# Patient Record
Sex: Female | Born: 1965 | Hispanic: No | Marital: Married | Smoking: Never smoker
Health system: Southern US, Community
[De-identification: ages and names within clinical notes are randomized; demographics above are authoritative.]

## PROBLEM LIST (undated history)

## (undated) DIAGNOSIS — I10 Essential (primary) hypertension: Secondary | ICD-10-CM

## (undated) DIAGNOSIS — Z789 Other specified health status: Secondary | ICD-10-CM

## (undated) HISTORY — PX: APPENDECTOMY: SHX54

---

## 2010-10-27 HISTORY — PX: BREAST EXCISIONAL BIOPSY: SUR124

## 2011-03-17 ENCOUNTER — Other Ambulatory Visit (HOSPITAL_COMMUNITY): Payer: Self-pay | Admitting: Internal Medicine

## 2011-03-17 DIAGNOSIS — R1031 Right lower quadrant pain: Secondary | ICD-10-CM

## 2011-03-19 ENCOUNTER — Ambulatory Visit (HOSPITAL_COMMUNITY)
Admission: RE | Admit: 2011-03-19 | Discharge: 2011-03-19 | Disposition: A | Discharge status: Home / Self Care | Payer: Self-pay | Source: Ambulatory Visit | Attending: Internal Medicine | Admitting: Internal Medicine

## 2011-03-19 DIAGNOSIS — R1031 Right lower quadrant pain: Secondary | ICD-10-CM | POA: Insufficient documentation

## 2011-03-31 ENCOUNTER — Other Ambulatory Visit (HOSPITAL_COMMUNITY): Payer: Self-pay | Admitting: Internal Medicine

## 2011-03-31 DIAGNOSIS — R1031 Right lower quadrant pain: Secondary | ICD-10-CM

## 2011-04-07 ENCOUNTER — Ambulatory Visit (HOSPITAL_COMMUNITY)
Admission: RE | Admit: 2011-04-07 | Discharge: 2011-04-07 | Disposition: A | Discharge status: Home / Self Care | Payer: Self-pay | Source: Ambulatory Visit | Attending: Internal Medicine | Admitting: Internal Medicine

## 2011-04-07 DIAGNOSIS — R1031 Right lower quadrant pain: Secondary | ICD-10-CM | POA: Insufficient documentation

## 2011-04-07 DIAGNOSIS — K7689 Other specified diseases of liver: Secondary | ICD-10-CM | POA: Insufficient documentation

## 2011-04-07 LAB — CREATININE, SERUM: Creatinine, Ser: 0.57 mg/dL (ref 0.4–1.2)

## 2011-04-07 MED ORDER — GADOXETATE DISODIUM 0.25 MMOL/ML IV SOLN
8.0000 mL | Freq: Once | INTRAVENOUS | Status: AC | PRN
  Administered 2011-04-07: 8 mL via INTRAVENOUS

## 2011-05-08 ENCOUNTER — Other Ambulatory Visit: Payer: Self-pay | Admitting: Internal Medicine

## 2011-05-08 DIAGNOSIS — N6313 Unspecified lump in the right breast, lower outer quadrant: Secondary | ICD-10-CM

## 2011-05-12 ENCOUNTER — Ambulatory Visit
Admission: RE | Admit: 2011-05-12 | Discharge: 2011-05-12 | Disposition: A | Discharge status: Home / Self Care | Payer: Self-pay | Source: Ambulatory Visit | Attending: Internal Medicine | Admitting: Internal Medicine

## 2011-05-12 ENCOUNTER — Other Ambulatory Visit: Payer: Self-pay | Admitting: Internal Medicine

## 2011-05-12 DIAGNOSIS — N6313 Unspecified lump in the right breast, lower outer quadrant: Secondary | ICD-10-CM

## 2011-05-13 ENCOUNTER — Other Ambulatory Visit: Payer: Self-pay | Admitting: Internal Medicine

## 2011-05-13 ENCOUNTER — Other Ambulatory Visit: Payer: Self-pay | Admitting: Radiology

## 2011-05-13 ENCOUNTER — Ambulatory Visit
Admission: RE | Admit: 2011-05-13 | Discharge: 2011-05-13 | Disposition: A | Discharge status: Home / Self Care | Payer: Self-pay | Source: Ambulatory Visit | Attending: Internal Medicine | Admitting: Internal Medicine

## 2011-05-13 DIAGNOSIS — N6313 Unspecified lump in the right breast, lower outer quadrant: Secondary | ICD-10-CM

## 2011-05-14 ENCOUNTER — Ambulatory Visit
Admission: RE | Admit: 2011-05-14 | Discharge: 2011-05-14 | Disposition: A | Discharge status: Home / Self Care | Payer: Self-pay | Source: Ambulatory Visit | Attending: Internal Medicine | Admitting: Internal Medicine

## 2011-05-14 DIAGNOSIS — N6313 Unspecified lump in the right breast, lower outer quadrant: Secondary | ICD-10-CM

## 2011-05-21 ENCOUNTER — Emergency Department (HOSPITAL_COMMUNITY)
Admission: EM | Admit: 2011-05-21 | Discharge: 2011-05-22 | Disposition: A | Discharge status: Home / Self Care | Payer: Self-pay | Attending: Emergency Medicine | Admitting: Emergency Medicine

## 2011-05-21 ENCOUNTER — Emergency Department: Payer: Self-pay

## 2011-05-21 DIAGNOSIS — R10819 Abdominal tenderness, unspecified site: Secondary | ICD-10-CM | POA: Insufficient documentation

## 2011-05-21 DIAGNOSIS — R1031 Right lower quadrant pain: Secondary | ICD-10-CM | POA: Insufficient documentation

## 2011-05-21 DIAGNOSIS — N949 Unspecified condition associated with female genital organs and menstrual cycle: Secondary | ICD-10-CM | POA: Insufficient documentation

## 2011-05-21 LAB — DIFFERENTIAL
Basophils Absolute: 0 10*3/uL (ref 0.0–0.1)
Eosinophils Relative: 4 % (ref 0–5)
Lymphocytes Relative: 26 % (ref 12–46)
Monocytes Absolute: 0.6 10*3/uL (ref 0.1–1.0)

## 2011-05-21 LAB — URINALYSIS, ROUTINE W REFLEX MICROSCOPIC
Bilirubin Urine: NEGATIVE
Ketones, ur: NEGATIVE mg/dL
Nitrite: NEGATIVE
Urobilinogen, UA: 0.2 mg/dL (ref 0.0–1.0)

## 2011-05-21 LAB — POCT PREGNANCY, URINE: Preg Test, Ur: NEGATIVE

## 2011-05-21 LAB — COMPREHENSIVE METABOLIC PANEL
Albumin: 4 g/dL (ref 3.5–5.2)
Alkaline Phosphatase: 90 U/L (ref 39–117)
BUN: 9 mg/dL (ref 6–23)
Creatinine, Ser: 0.52 mg/dL (ref 0.50–1.10)
Potassium: 3.5 mEq/L (ref 3.5–5.1)
Total Protein: 8.2 g/dL (ref 6.0–8.3)

## 2011-05-21 LAB — CBC
HCT: 37.4 % (ref 36.0–46.0)
MCHC: 33.2 g/dL (ref 30.0–36.0)
RDW: 13 % (ref 11.5–15.5)

## 2011-05-22 ENCOUNTER — Inpatient Hospital Stay (HOSPITAL_COMMUNITY)
Admission: AD | Admit: 2011-05-22 | Discharge: 2011-05-22 | Disposition: A | Discharge status: Home / Self Care | Payer: Self-pay | Source: Ambulatory Visit | Attending: Obstetrics & Gynecology | Admitting: Obstetrics & Gynecology

## 2011-05-22 ENCOUNTER — Encounter (HOSPITAL_COMMUNITY): Payer: Self-pay | Admitting: *Deleted

## 2011-05-22 ENCOUNTER — Emergency Department (HOSPITAL_COMMUNITY): Payer: Self-pay

## 2011-05-22 DIAGNOSIS — N83209 Unspecified ovarian cyst, unspecified side: Secondary | ICD-10-CM | POA: Insufficient documentation

## 2011-05-22 DIAGNOSIS — R109 Unspecified abdominal pain: Secondary | ICD-10-CM | POA: Insufficient documentation

## 2011-05-22 HISTORY — DX: Other specified health status: Z78.9

## 2011-05-22 MED ORDER — HYDROCODONE-ACETAMINOPHEN 7.5-500 MG PO TABS: 1.0000 | ORAL_TABLET | Freq: Four times a day (QID) | ORAL | Status: AC | PRN

## 2011-05-22 MED ORDER — KETOROLAC TROMETHAMINE 60 MG/2ML IM SOLN
60.0000 mg | Freq: Once | INTRAMUSCULAR | Status: AC
  Administered 2011-05-22: 60 mg via INTRAMUSCULAR
  Filled 2011-05-22: qty 2

## 2011-05-22 NOTE — ED Provider Notes (Signed)
History   Pt presents for what she understands to be a follow-up from an ER visit to [facility] last pm for right abd pain. Had labs and [place] were reviewed and showed a possible R ovarian cyst. Did not fill pain rx (ibuprofen/Percocet) that were given, and still with right abd pain currently.  States pain comes approx q 3 wks and then spont resolves.  Chief Complaint  Patient presents with  . Abdominal Pain   HPI  OB History    Grav Para Term Preterm Abortions TAB SAB Ect Mult Living   2 2 2       2       Past Medical History  Diagnosis Date  . No pertinent past medical history     Past Surgical History  Procedure Date  . Cesarean section     No family history on file.  History  Substance Use Topics  . Smoking status: Never Smoker   . Smokeless tobacco: Never Used  . Alcohol Use: No    Allergies: No Known Allergies  No prescriptions prior to admission    ROS Physical Exam   Blood pressure 153/77, pulse 85, temperature 98.3 F (36.8 C), temperature source Oral, resp. rate 18, height 5\' 4"  (1.626 m), weight 80.287 kg (177 lb).  Physical Exam Abd: soft, tender to palp on right, no rebound, no guard MAU Course  Procedures  MDM   Assessment and Plan  Abd pain, possibly from ovarian cyst  Rec to fill pain med rxes. Will give Toradol 60mg  here and will place pt for a 1-2 wk appt at gyn clinic for further eval.  SHAW,KIMBERLY B 05/22/2011, 9:03 PM

## 2011-05-22 NOTE — Progress Notes (Signed)
Pt per interpreter states she has lower abd pain x 6 months off/on. Seen at [facility] long er last pm and had an u/s and was told she had a cyst on the right and [facility] . Denies dysuria, abn bleeding, nausea, vomiting diarrhea, and fever. Was given ibuprofen and it helps some

## 2011-05-24 ENCOUNTER — Inpatient Hospital Stay (HOSPITAL_COMMUNITY): Admission: RE | Admit: 2011-05-24 | Payer: Self-pay | Source: Ambulatory Visit

## 2011-06-03 ENCOUNTER — Other Ambulatory Visit: Payer: Self-pay | Admitting: Family Medicine

## 2011-06-03 DIAGNOSIS — N83209 Unspecified ovarian cyst, unspecified side: Secondary | ICD-10-CM

## 2011-06-17 ENCOUNTER — Other Ambulatory Visit (HOSPITAL_COMMUNITY): Payer: Self-pay

## 2011-07-02 ENCOUNTER — Encounter: Payer: Self-pay | Admitting: Obstetrics & Gynecology

## 2012-10-06 IMAGING — US US ABDOMEN COMPLETE
1 series · 14 of 25 positions shown · non-contrast
Comparison: None.

CLINICAL DATA: Right lower quadrant abdominal pain.

COMPLETE ABDOMINAL ULTRASOUND

[Series 1: us abdomen complete · 0.26mm/px · 14 of 70 slices shown]
[im 1/70]
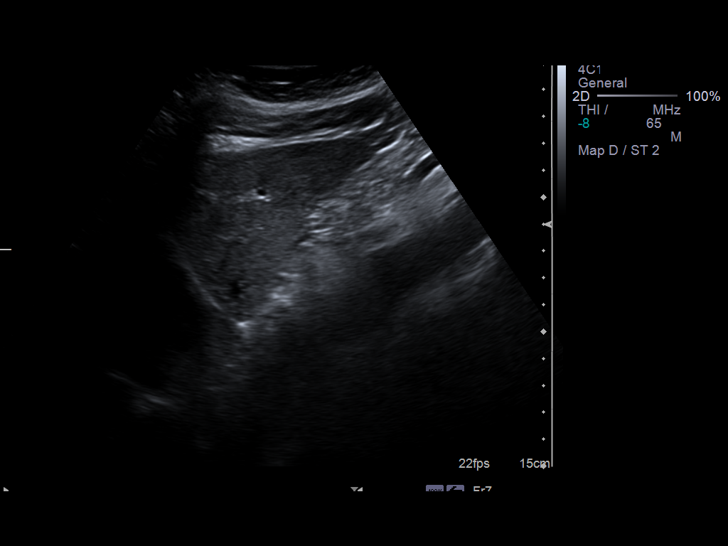
[im 6/70]
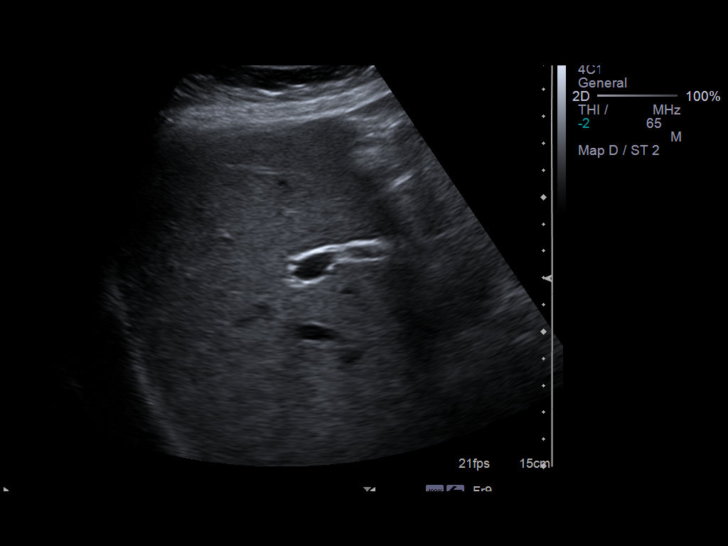
[im 12/70]
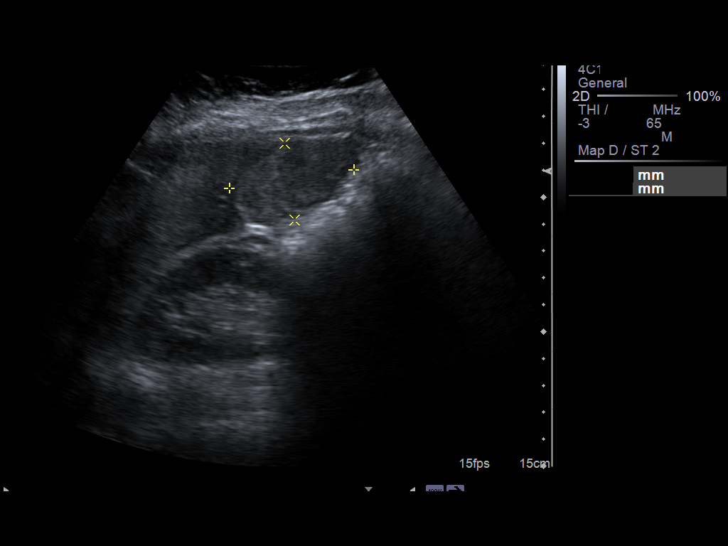
[im 18/70]
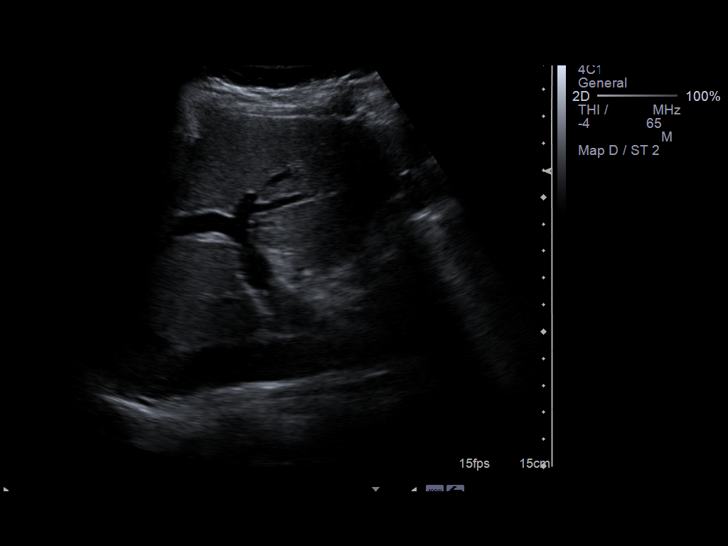
[im 24/70]
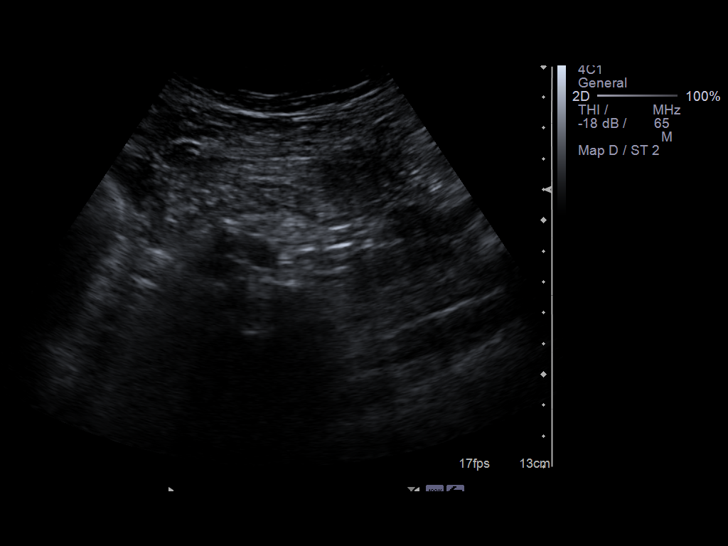
[im 26/70]
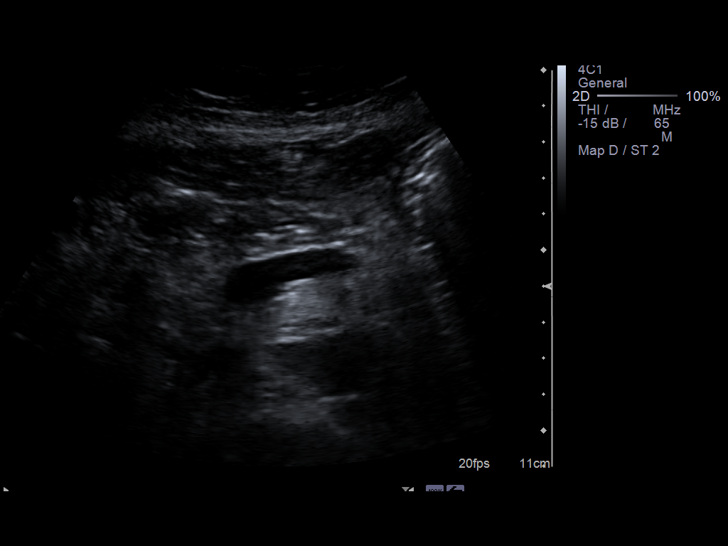
[im 32/70]
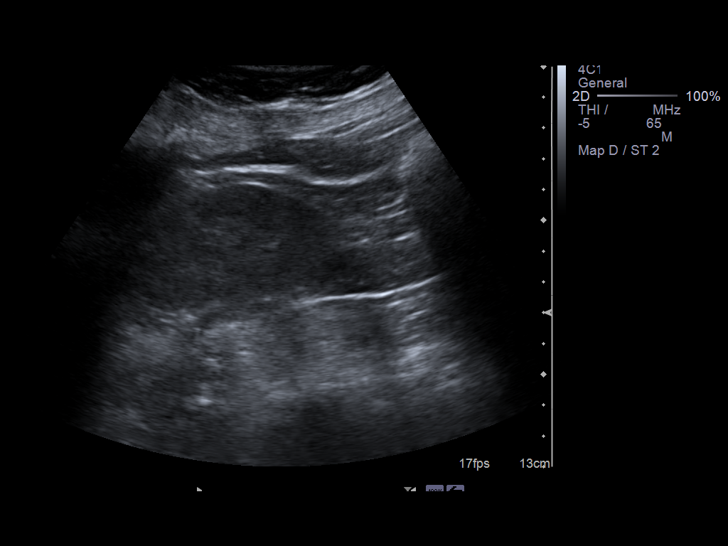
[im 38/70]
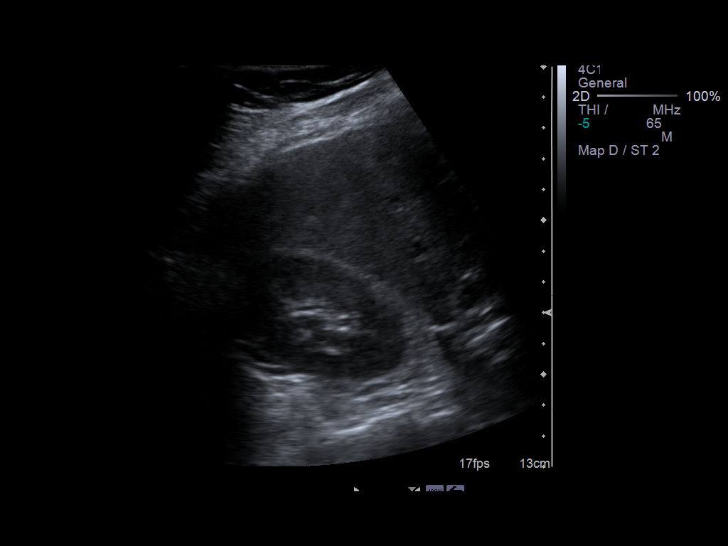
[im 44/70]
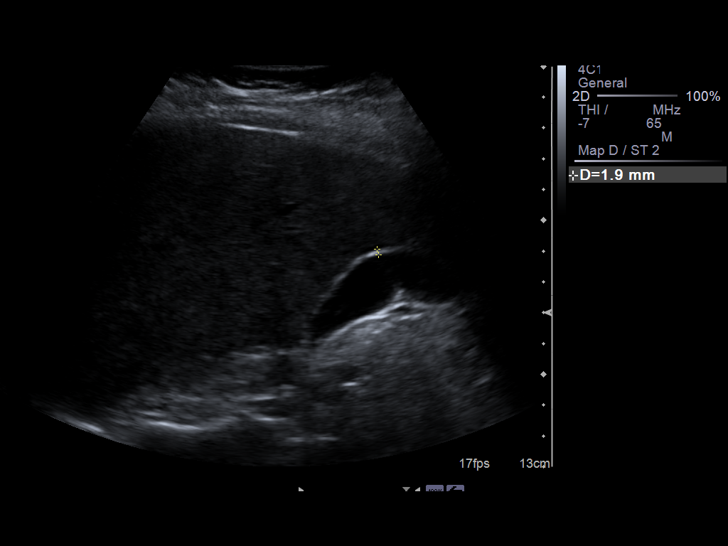
[im 47/70]
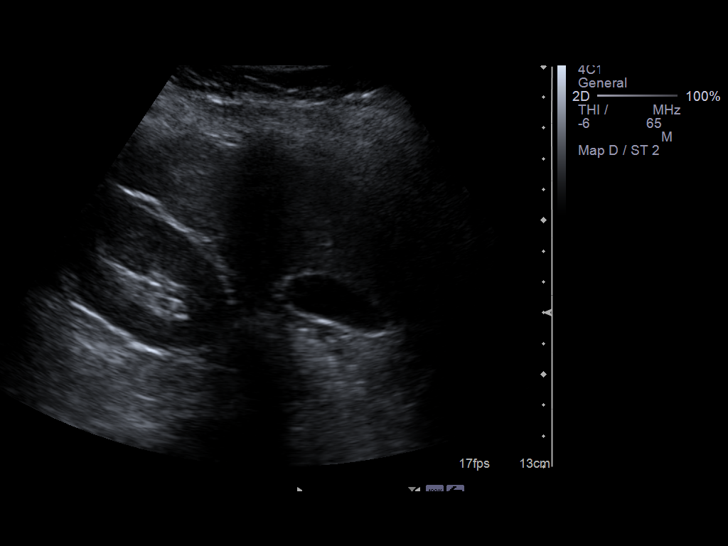
[im 52/70]
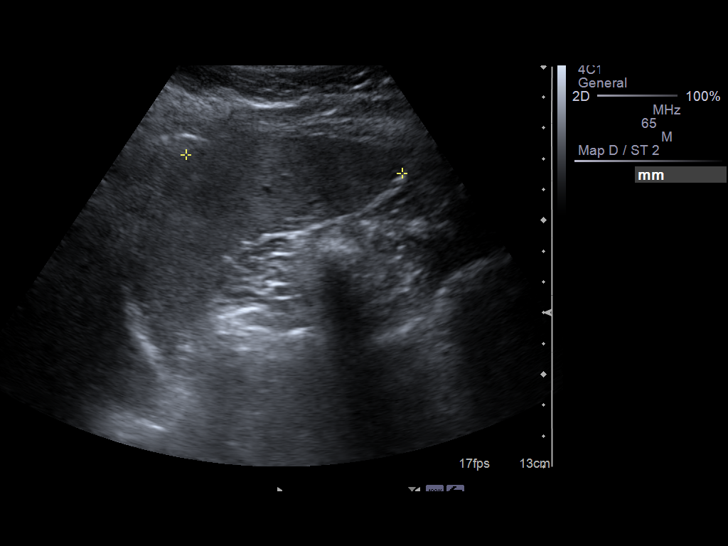
[im 58/70]
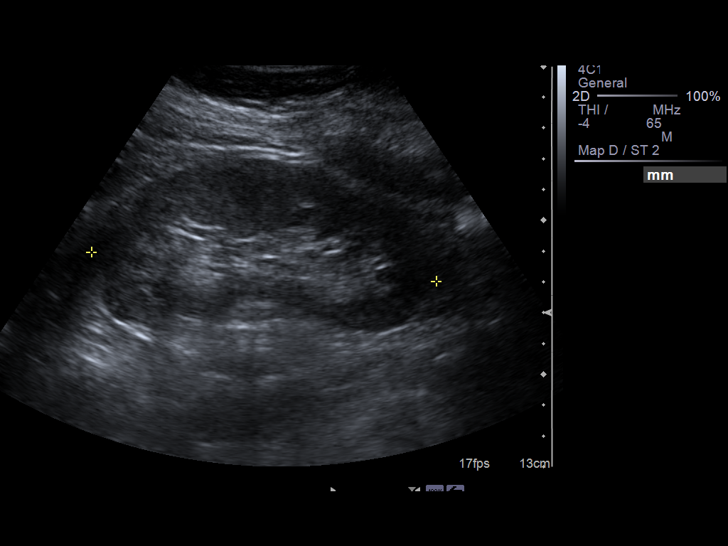
[im 64/70]
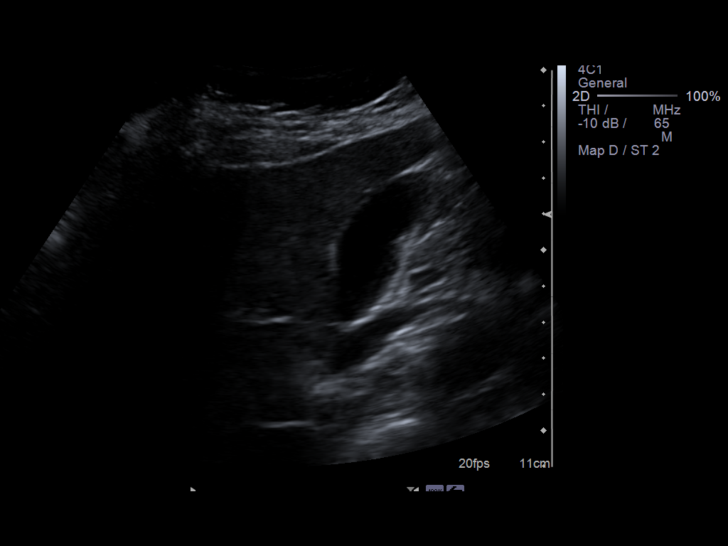
[im 70/70]
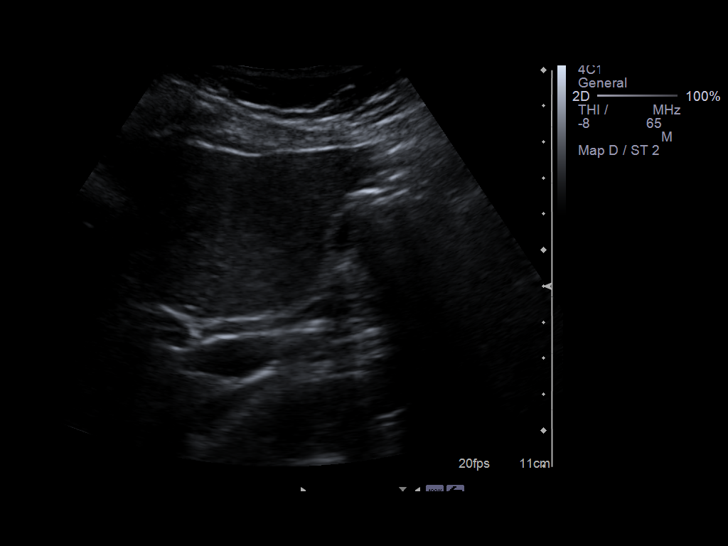

[14 of 25 positions shown; findings below may reference images not displayed]

FINDINGS: Gallbladder:  Normal, without wall thickening, stone, or
pericholecystic fluid.  Sonographic Murphy's sign was not elicited.

Common bile duct: Normal, at 4 mm.

Liver: Within the right lobe of the liver, subcapsular area of
vague relative hyperechogenicity measures 4.7 x 2.9 x 4.0 cm.
Images 13 and 17.  Underlying heterogeneous hepatic echotexture
identified.

IVC: Negative

Pancreas:  Negative

Spleen:  Normal in size and echogenicity.

Right Kidney:  11.1 cm. No hydronephrosis.

Left Kidney:  11.2 cm. No hydronephrosis.

Abdominal aorta:  Nonaneurysmal without ascites.
IMPRESSION: 1.  Heterogeneous hepatic echotexture likely represents hepatic
steatosis.  An area of more focal hyperechogenicity in the
subcapsular right lobe is indeterminate.  Although this could be an
area of focal steatosis, a true mass cannot be excluded.  Further
characterization with pre and post contrast abdominal MRI, using
Eovist intravenous contrast is recommended.  The study was made a
"call report."
2.  No other acute process or explanation for right-sided pain.

## 2014-08-28 ENCOUNTER — Encounter (HOSPITAL_COMMUNITY): Payer: Self-pay | Admitting: *Deleted

## 2018-08-12 ENCOUNTER — Other Ambulatory Visit: Payer: Self-pay | Admitting: Nurse Practitioner

## 2018-08-12 ENCOUNTER — Other Ambulatory Visit: Payer: Self-pay | Admitting: Family Medicine

## 2018-08-17 ENCOUNTER — Other Ambulatory Visit (HOSPITAL_COMMUNITY): Payer: Self-pay | Admitting: *Deleted

## 2018-08-17 DIAGNOSIS — N6310 Unspecified lump in the right breast, unspecified quadrant: Secondary | ICD-10-CM

## 2018-09-06 ENCOUNTER — Other Ambulatory Visit (HOSPITAL_COMMUNITY): Payer: Self-pay | Admitting: *Deleted

## 2018-09-06 DIAGNOSIS — Z0000 Encounter for general adult medical examination without abnormal findings: Secondary | ICD-10-CM

## 2018-09-07 ENCOUNTER — Encounter (HOSPITAL_COMMUNITY): Payer: Self-pay

## 2018-09-07 ENCOUNTER — Ambulatory Visit (HOSPITAL_COMMUNITY)
Admission: RE | Admit: 2018-09-07 | Discharge: 2018-09-07 | Disposition: A | Discharge status: Home / Self Care | Payer: Self-pay | Source: Ambulatory Visit | Attending: Obstetrics and Gynecology | Admitting: Obstetrics and Gynecology

## 2018-09-07 VITALS — BP 162/94 | Wt 188.0 lb

## 2018-09-07 DIAGNOSIS — N644 Mastodynia: Secondary | ICD-10-CM

## 2018-09-07 DIAGNOSIS — Z1239 Encounter for other screening for malignant neoplasm of breast: Secondary | ICD-10-CM

## 2018-09-07 DIAGNOSIS — N6312 Unspecified lump in the right breast, upper inner quadrant: Secondary | ICD-10-CM

## 2018-09-07 HISTORY — DX: Essential (primary) hypertension: I10

## 2018-09-07 NOTE — Patient Instructions (Signed)
Explained breast self awareness with Gianella Welke. Patient did not need a Pap smear today due to last Pap smear was in October 2019 per patient. Let her know [facility] will cover Pap smears every 3 years unless has a history of abnormal Pap smears. Referred patient to the [facility] for a diagnostic mammogram and possible right breast ultrasound. Appointment scheduled for Wednesday, September 08, 2018 at 1030. Patient aware of appointment and will be there. Angel Riley verbalized understanding.  Brannock, Kathaleen Maser, RN 3:55 PM

## 2018-09-07 NOTE — Progress Notes (Signed)
Complaints of a right breast lump and pain x 7 years. Patient states the pain was constant 5 years ago but now is only painful when touches. Patient rates the pain at a 3 out of 10.  Pap Smear: Pap smear not completed today. [facility] and awaiting results. Per patient has no history of an abnormal Pap smear. No Pap smear results are in Epic.  Physical exam: Breasts Breasts symmetrical. No skin abnormalities bilateral breasts. No nipple retraction bilateral breasts. No nipple discharge bilateral breasts. No lymphadenopathy. No lumps palpated left breast. Palpated a lump within the right breast at 2 o'clock 2 cm from the nipple. Complaints of tenderness when palpated lump. [facility] for a diagnostic mamm[facility] and possible right breast ultrasound. Appointment scheduled for Wednesday, September 08, 2018 at 1030.        Pelvic/Bimanual No Pap smear completed today since last Pap smear was in October 2019. Pap smear not indicated per [facility] guidelines.   Smoking History: Patient has never smoked.  Patient Navigation: Patient education provided. [facility] to [facility]. Amharic interpreter provided.   Colorectal Cancer Screening: Per patient has never had a colonoscopy completed. No complaints today. FIT Test given to patient to complete and return to [facility].  Breast and Cervical Cancer Risk Assessment: Patient has a family history of a sister having breast cancer. Patient has no known genetic mutations or history of radiation treatment to the chest before age 52. Patient has no history of cervical dysplasia, immunocompromised, or DES exposure in-utero.  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

## 2018-09-08 ENCOUNTER — Ambulatory Visit
Admission: RE | Admit: 2018-09-08 | Discharge: 2018-09-08 | Disposition: A | Discharge status: Home / Self Care | Payer: Self-pay | Source: Ambulatory Visit | Attending: Obstetrics and Gynecology | Admitting: Obstetrics and Gynecology

## 2018-09-08 ENCOUNTER — Ambulatory Visit: Payer: Self-pay

## 2018-09-08 DIAGNOSIS — N6310 Unspecified lump in the right breast, unspecified quadrant: Secondary | ICD-10-CM

## 2018-09-09 NOTE — Addendum Note (Signed)
Addended by: Diangelo Radel on: 09/09/2018 01:06 PM   Modules accepted: Orders  

## 2018-09-09 NOTE — Addendum Note (Signed)
Addended by: Kathreen CornfieldSMITH, Zaelyn Barbary on: 09/09/2018 01:06 PM   Modules accepted: Orders

## 2018-09-10 ENCOUNTER — Inpatient Hospital Stay: Payer: PRIVATE HEALTH INSURANCE

## 2018-09-10 ENCOUNTER — Encounter (HOSPITAL_COMMUNITY): Payer: Self-pay

## 2018-09-10 ENCOUNTER — Ambulatory Visit: Payer: Self-pay

## 2018-09-10 ENCOUNTER — Other Ambulatory Visit: Payer: Self-pay

## 2018-09-17 ENCOUNTER — Encounter (HOSPITAL_COMMUNITY): Payer: Self-pay | Admitting: *Deleted

## 2018-09-20 LAB — FECAL OCCULT BLOOD, IMMUNOCHEMICAL: FECAL OCCULT BLD: NEGATIVE

## 2018-10-04 ENCOUNTER — Encounter (HOSPITAL_COMMUNITY): Payer: Self-pay

## 2020-03-28 IMAGING — MG DIGITAL DIAGNOSTIC BILATERAL MAMMOGRAM WITH TOMO AND CAD
6 of 10 series · 6 of 30 positions shown · non-contrast
Comparison: 05/12/2011 mammogram and ultrasound

CLINICAL DATA: 52-year-old female with palpable mass and discomfort
within the UPPER INNER RIGHT breast for many years and for annual
bilateral mammogram. History of biopsy-proven fibroadenoma within
the UPPER INNER RIGHT breast.

EXAM:
DIGITAL DIAGNOSTIC BILATERAL MAMMOGRAM WITH CAD AND TOMO

[R MLO synth-2D]
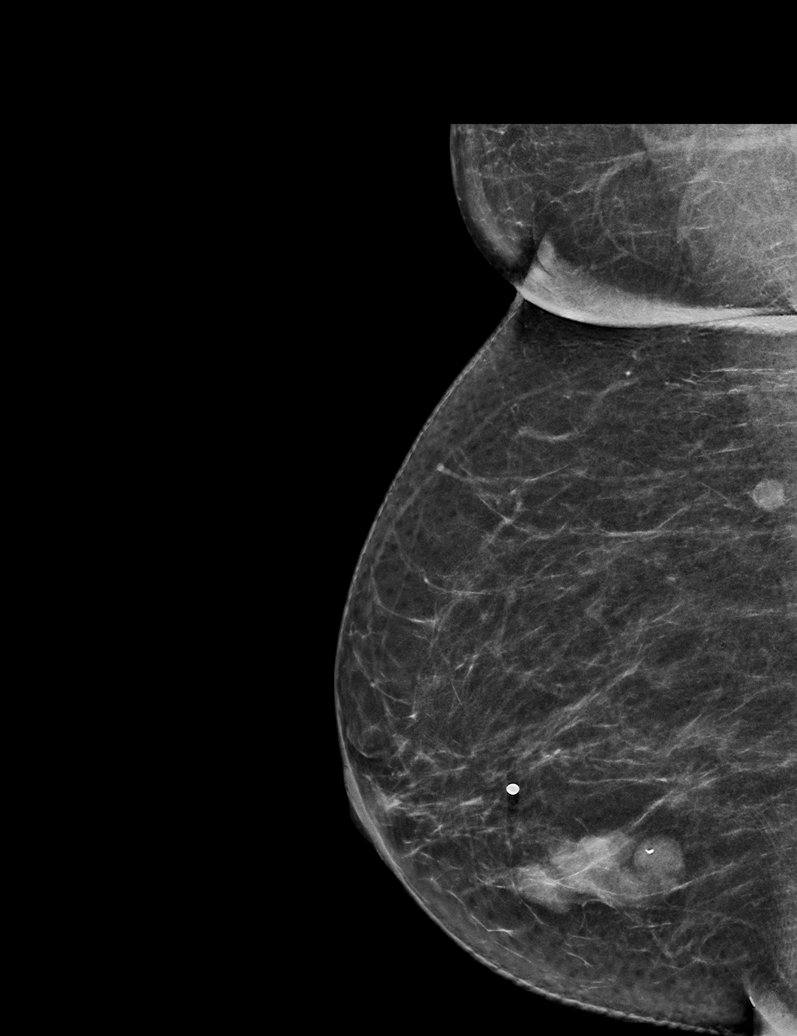

[R CC synth-2D (1 of 2)]
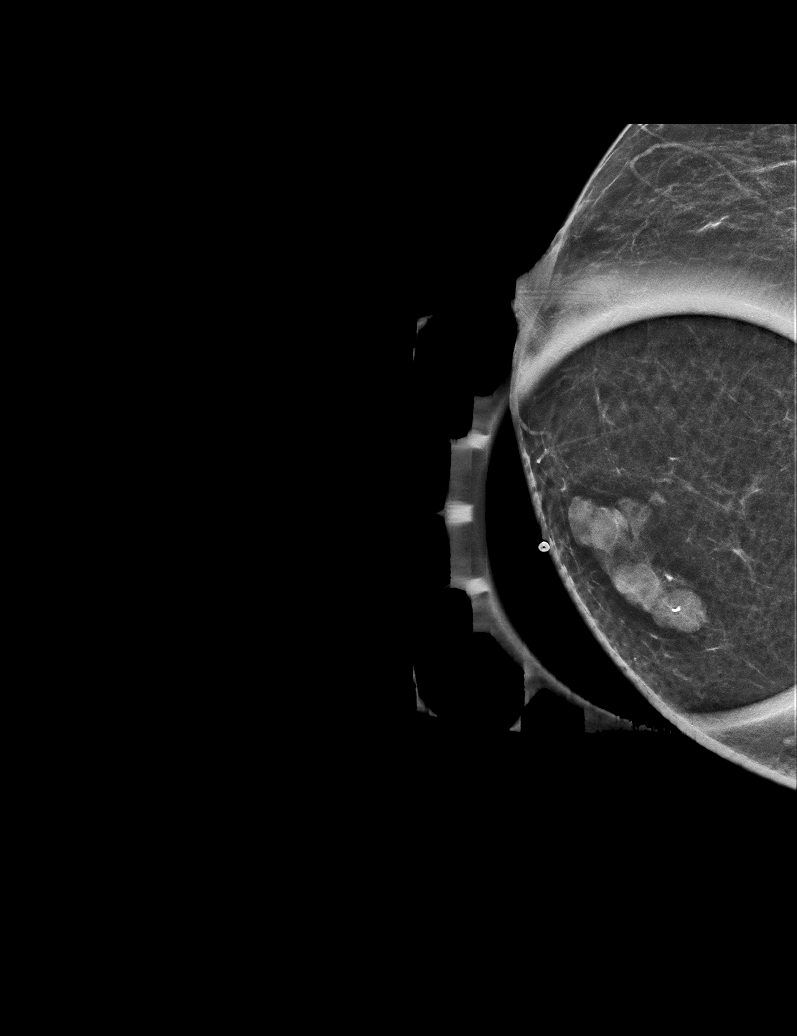

[L MLO synth-2D]
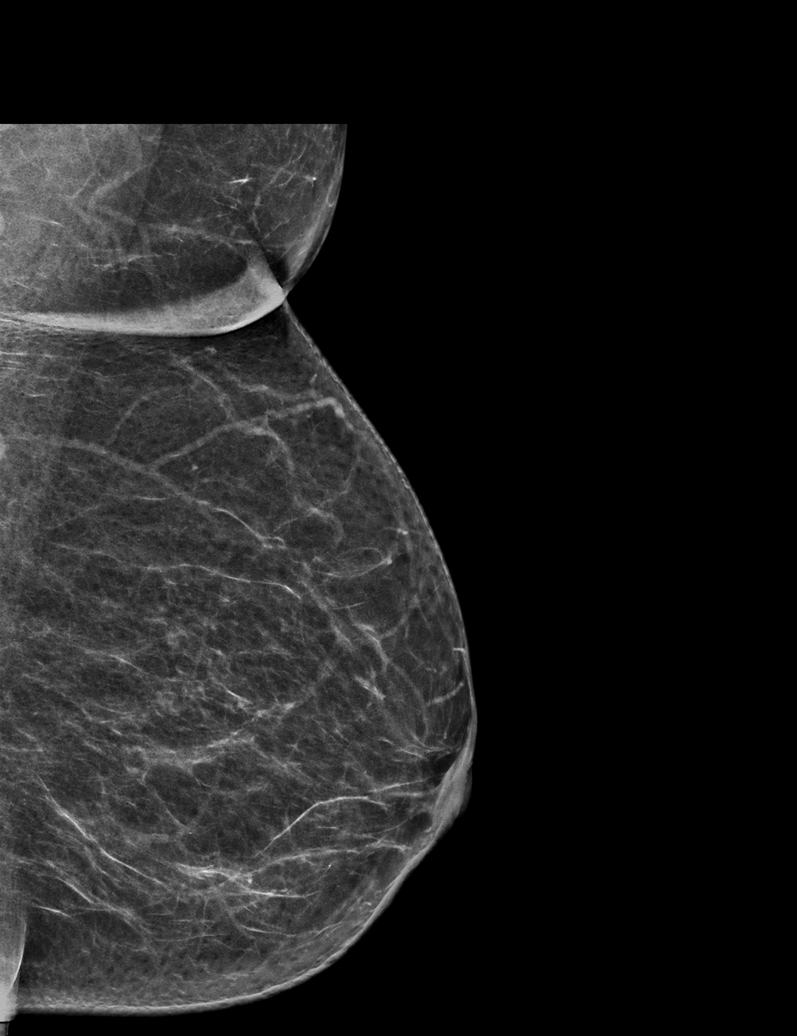

[R CC synth-2D (2 of 2)]
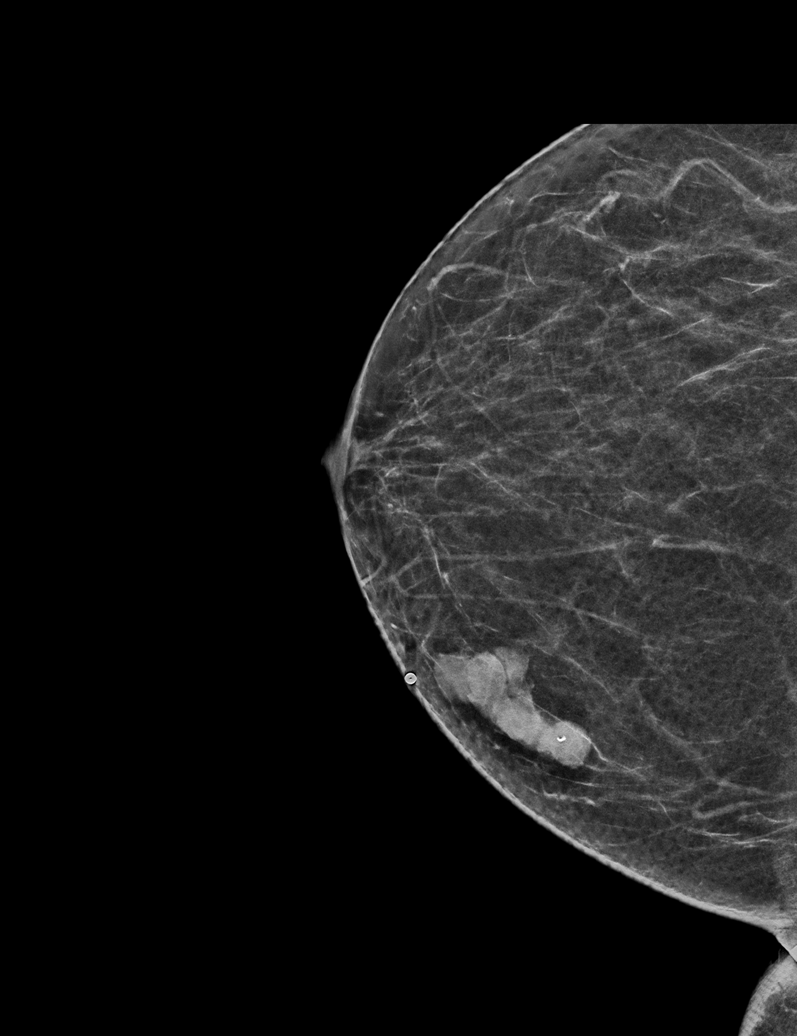

[L CC synth-2D]
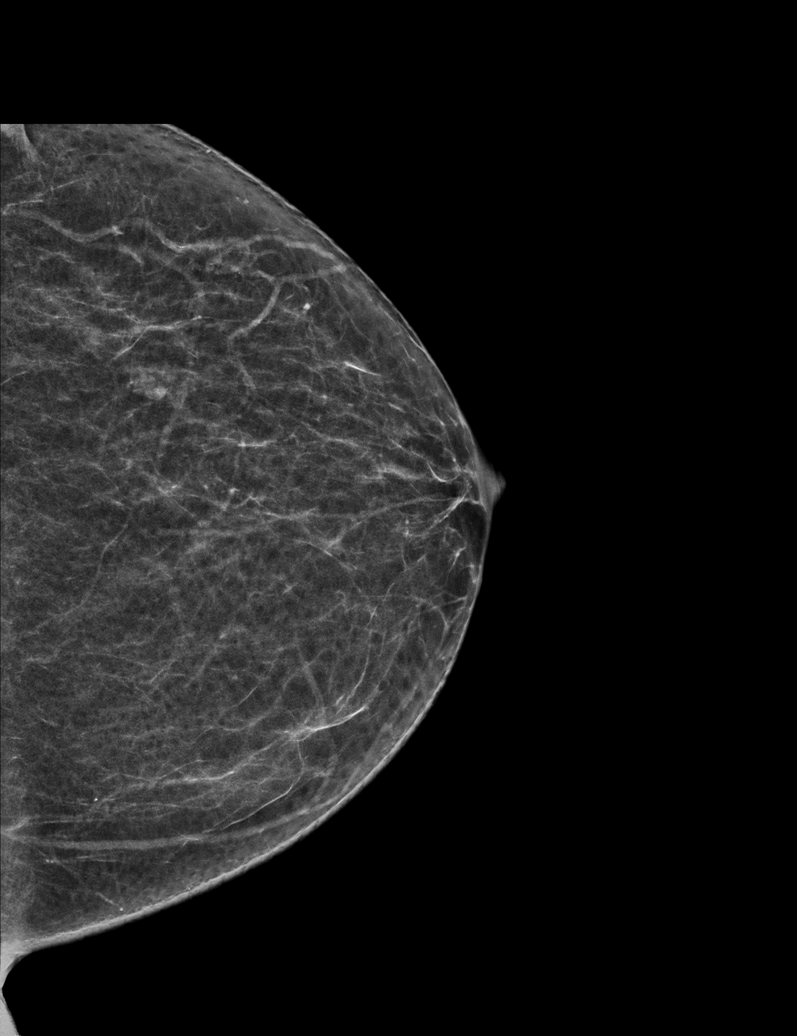

[L CC tomo · tomo slice 29/56.0]
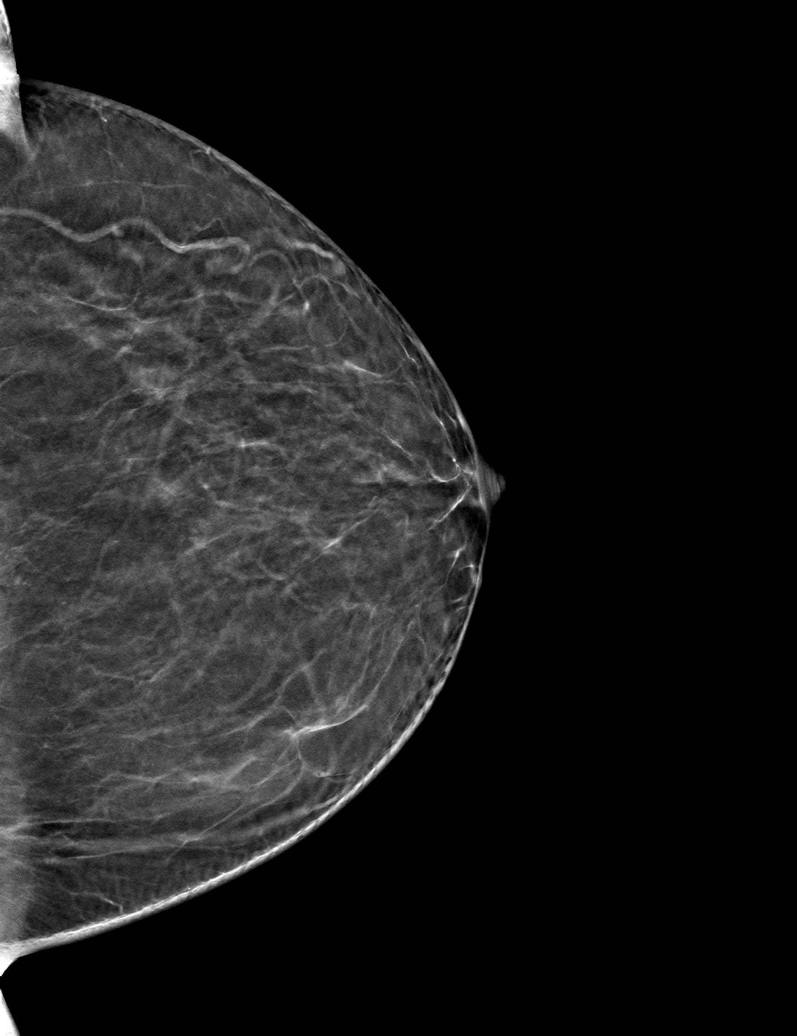

[6 of 30 positions shown; findings below may reference images not displayed]

ACR Breast Density Category b: There are scattered areas of
fibroglandular density.
FINDINGS: 2D/3D full field views of both breast demonstrate a stable 1.5 x 3
cm circumscribed oval mass within the UPPER INNER LEFT breast with
biopsy clip, unchanged from 0920 and compatible with biopsy-proven
fibroadenoma. This fibroadenoma corresponds to the BB placed at the
site of the patient's palpable abnormality.

A stable 7 mm circumscribed mass within the posterior UPPER RIGHT
breast is also unchanged 0920.

No suspicious mass, distortion or worrisome calcifications are noted
within either breast.

Mammographic images were processed with CAD.
IMPRESSION: 1. Stable biopsy-proven fibroadenoma within the UPPER INNER RIGHT
breast, corresponding to the patient's palpable abnormality.
2. No mammographic evidence of breast malignancy.

RECOMMENDATION:
Bilateral screening mammogram in 1 year

I have discussed the findings and recommendations with the patient.
Results were also provided in writing at the conclusion of the
visit. If applicable, a reminder letter will be sent to the patient
regarding the next appointment.

BI-RADS CATEGORY  2: Benign.
